# Patient Record
Sex: Female | Born: 1995 | Race: Black or African American | Hispanic: No | Marital: Single | Smoking: Never smoker
Health system: Southern US, Community
[De-identification: ages and names within clinical notes are randomized; demographics above are authoritative.]

---
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
## 2017-06-06 ENCOUNTER — Emergency Department (HOSPITAL_COMMUNITY)
Admission: EM | Admit: 2017-06-06 | Discharge: 2017-06-06 | Disposition: A | Discharge status: Home / Self Care | Payer: Self-pay | Attending: Physician Assistant | Admitting: Physician Assistant

## 2017-06-06 ENCOUNTER — Encounter (HOSPITAL_COMMUNITY): Payer: Self-pay | Admitting: *Deleted

## 2017-06-06 ENCOUNTER — Emergency Department (HOSPITAL_COMMUNITY): Payer: Self-pay

## 2017-06-06 DIAGNOSIS — Y9389 Activity, other specified: Secondary | ICD-10-CM | POA: Insufficient documentation

## 2017-06-06 DIAGNOSIS — T59811A Toxic effect of smoke, accidental (unintentional), initial encounter: Secondary | ICD-10-CM

## 2017-06-06 DIAGNOSIS — Z79899 Other long term (current) drug therapy: Secondary | ICD-10-CM | POA: Insufficient documentation

## 2017-06-06 DIAGNOSIS — J705 Respiratory conditions due to smoke inhalation: Secondary | ICD-10-CM | POA: Insufficient documentation

## 2017-06-06 DIAGNOSIS — Y92039 Unspecified place in apartment as the place of occurrence of the external cause: Secondary | ICD-10-CM | POA: Insufficient documentation

## 2017-06-06 DIAGNOSIS — X021XXA Exposure to smoke in controlled fire in building or structure, initial encounter: Secondary | ICD-10-CM | POA: Insufficient documentation

## 2017-06-06 LAB — CBC WITH DIFFERENTIAL/PLATELET
Basophils Absolute: 0 10*3/uL (ref 0.0–0.1)
Basophils Relative: 0 %
EOS ABS: 0 10*3/uL (ref 0.0–0.7)
EOS PCT: 0 %
HCT: 36.4 % (ref 36.0–46.0)
Hemoglobin: 11.5 g/dL — ABNORMAL LOW (ref 12.0–15.0)
Lymphocytes Relative: 26 %
Lymphs Abs: 1.4 10*3/uL (ref 0.7–4.0)
MCH: 26 pg (ref 26.0–34.0)
MCHC: 31.6 g/dL (ref 30.0–36.0)
MCV: 82.4 fL (ref 78.0–100.0)
Monocytes Absolute: 0.5 10*3/uL (ref 0.1–1.0)
Monocytes Relative: 9 %
Neutro Abs: 3.6 10*3/uL (ref 1.7–7.7)
Neutrophils Relative %: 65 %
PLATELETS: 328 10*3/uL (ref 150–400)
RBC: 4.42 MIL/uL (ref 3.87–5.11)
RDW: 16.2 % — ABNORMAL HIGH (ref 11.5–15.5)
WBC: 5.5 10*3/uL (ref 4.0–10.5)

## 2017-06-06 LAB — COMPREHENSIVE METABOLIC PANEL
ALT: 15 U/L (ref 14–54)
AST: 21 U/L (ref 15–41)
Albumin: 4.3 g/dL (ref 3.5–5.0)
Alkaline Phosphatase: 83 U/L (ref 38–126)
Anion gap: 8 (ref 5–15)
BILIRUBIN TOTAL: 0.4 mg/dL (ref 0.3–1.2)
BUN: 9 mg/dL (ref 6–20)
CHLORIDE: 104 mmol/L (ref 101–111)
CO2: 26 mmol/L (ref 22–32)
Calcium: 9.2 mg/dL (ref 8.9–10.3)
Creatinine, Ser: 0.73 mg/dL (ref 0.44–1.00)
Glucose, Bld: 96 mg/dL (ref 65–99)
POTASSIUM: 3.9 mmol/L (ref 3.5–5.1)
SODIUM: 138 mmol/L (ref 135–145)
TOTAL PROTEIN: 7.9 g/dL (ref 6.5–8.1)

## 2017-06-06 NOTE — Discharge Instructions (Signed)
We are glad you are feeling better.  Please return with any lightheadedness dizziness cough or other concerns.
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
## 2017-06-15 ENCOUNTER — Emergency Department (HOSPITAL_COMMUNITY)
Admission: EM | Admit: 2017-06-15 | Discharge: 2017-06-15 | Disposition: A | Discharge status: Home / Self Care | Payer: Self-pay | Attending: Emergency Medicine | Admitting: Emergency Medicine

## 2017-06-15 ENCOUNTER — Other Ambulatory Visit: Payer: Self-pay

## 2017-06-15 ENCOUNTER — Encounter (HOSPITAL_COMMUNITY): Payer: Self-pay | Admitting: Emergency Medicine

## 2017-06-15 DIAGNOSIS — K115 Sialolithiasis: Secondary | ICD-10-CM

## 2017-06-15 DIAGNOSIS — K1120 Sialoadenitis, unspecified: Secondary | ICD-10-CM | POA: Insufficient documentation

## 2017-06-15 LAB — COMPREHENSIVE METABOLIC PANEL
ALBUMIN: 4 g/dL (ref 3.5–5.0)
ALT: 29 U/L (ref 14–54)
ANION GAP: 10 (ref 5–15)
AST: 28 U/L (ref 15–41)
Alkaline Phosphatase: 78 U/L (ref 38–126)
BUN: 8 mg/dL (ref 6–20)
CHLORIDE: 104 mmol/L (ref 101–111)
CO2: 25 mmol/L (ref 22–32)
Calcium: 8.9 mg/dL (ref 8.9–10.3)
Creatinine, Ser: 0.73 mg/dL (ref 0.44–1.00)
GFR calc Af Amer: >60 mL/min (ref >60)
Glucose, Bld: 105 mg/dL — ABNORMAL HIGH (ref 65–99)
POTASSIUM: 3.5 mmol/L (ref 3.5–5.1)
Sodium: 139 mmol/L (ref 135–145)
Total Bilirubin: 0.5 mg/dL (ref 0.3–1.2)
Total Protein: 8 g/dL (ref 6.5–8.1)

## 2017-06-15 LAB — CBC WITH DIFFERENTIAL/PLATELET
Basophils Absolute: 0 10*3/uL (ref 0.0–0.1)
Basophils Relative: 0 %
EOS PCT: 0 %
Eosinophils Absolute: 0 10*3/uL (ref 0.0–0.7)
HEMATOCRIT: 36.8 % (ref 36.0–46.0)
HEMOGLOBIN: 11.8 g/dL — AB (ref 12.0–15.0)
LYMPHS ABS: 1.8 10*3/uL (ref 0.7–4.0)
LYMPHS PCT: 35 %
MCH: 26 pg (ref 26.0–34.0)
MCHC: 32.1 g/dL (ref 30.0–36.0)
MCV: 81.1 fL (ref 78.0–100.0)
Monocytes Absolute: 0.6 10*3/uL (ref 0.1–1.0)
Monocytes Relative: 11 %
NEUTROS ABS: 2.8 10*3/uL (ref 1.7–7.7)
Neutrophils Relative %: 54 %
Platelets: 363 10*3/uL (ref 150–400)
RBC: 4.54 MIL/uL (ref 3.87–5.11)
RDW: 16.1 % — ABNORMAL HIGH (ref 11.5–15.5)
WBC: 5.2 10*3/uL (ref 4.0–10.5)

## 2017-06-15 LAB — I-STAT BETA HCG BLOOD, ED (MC, WL, AP ONLY): I-stat hCG, quantitative: <5 m[IU]/mL (ref <5)

## 2017-06-15 MED ORDER — DEXAMETHASONE SODIUM PHOSPHATE 10 MG/ML IJ SOLN
10.0000 mg | Freq: Once | INTRAMUSCULAR | Status: AC
  Administered 2017-06-15: 10 mg via INTRAMUSCULAR
  Filled 2017-06-15: qty 1

## 2017-06-15 MED ORDER — AMOXICILLIN-POT CLAVULANATE 875-125 MG PO TABS: 1.0000 | ORAL_TABLET | Freq: Two times a day (BID) | ORAL | 0 refills | Status: AC

## 2017-06-15 NOTE — ED Provider Notes (Signed)
[facility]-EMERGENCY DEPT Provider Note   CSN: [id number] Arrival date & time: 06/15/17  1135     History   Chief Complaint Chief Complaint  Patient presents with  . Sore Throat  . Lymphadenopathy    HPI Heather Guerra is a 21 y.o. otherwise healthy female, who presents to the ED with complaints of right submandibular area swelling and pain that began while she was eating yesterday.  Patient states that she was eating when suddenly she noticed that her right jaw/submandibular area became swollen and was hurting.  She describes the pain as 3/10 intermittent throbbing and aching right jaw pain that radiates into her throat on the right side, worse with eating, and moderately improved with ibuprofen.  She denies any known sick contacts.  She has never had anything like this before.  She denies any dental pain, gum swelling or drainage, drooling, trismus, ear pain or drainage, rhinorrhea, other URI symptoms, fevers, chills, CP, SOB, abd pain, N/V/D/C, hematuria, dysuria, myalgias, arthralgias, numbness, tingling, focal weakness, or any other complaints at this time.    The history is provided by medical records and the patient. No language interpreter was used.  Sore Throat  This is a new problem. The current episode started yesterday. Episode frequency: intermittent. The problem has not changed since onset.Pertinent negatives include no chest pain, no abdominal pain and no shortness of breath. The symptoms are aggravated by eating. The symptoms are relieved by NSAIDs. Treatments tried: ibuprofen. The treatment provided moderate relief.    History reviewed. No pertinent past medical history.  There are no active problems to display for this patient.   History reviewed. No pertinent surgical history.  OB History    No data available       Home Medications    Prior to Admission medications   Not on File    Family History No family history on  file.  Social History Social History   Tobacco Use  . Smoking status: Never Smoker  . Smokeless tobacco: Never Used  Substance Use Topics  . Alcohol use: No  . Drug use: No     Allergies   Patient has no known allergies.   Review of Systems Review of Systems  Constitutional: Negative for chills and fever.  HENT: Positive for facial swelling and sore throat. Negative for dental problem, drooling, ear discharge, ear pain, rhinorrhea and trouble swallowing.   Respiratory: Negative for cough and shortness of breath.   Cardiovascular: Negative for chest pain.  Gastrointestinal: Negative for abdominal pain, constipation, diarrhea, nausea and vomiting.  Genitourinary: Negative for dysuria and hematuria.  Musculoskeletal: Negative for arthralgias and myalgias.  Skin: Negative for color change.  Allergic/Immunologic: Negative for immunocompromised state.  Neurological: Negative for weakness and numbness.  Psychiatric/Behavioral: Negative for confusion.   All other systems reviewed and are negative for acute change except as noted in the HPI.    Physical Exam Updated Vital Signs BP (!) 143/91 (BP Location: Left Arm)   Pulse (!) 104   Temp 98.6 F (37 C) (Oral)   Resp 18   LMP 06/06/2017   SpO2 100%   Physical Exam  Constitutional: She is oriented to person, place, and time. Vital signs are normal. She appears well-developed and well-nourished.  Non-toxic appearance. No distress.  Afebrile, nontoxic, NAD  HENT:  Head: Normocephalic and atraumatic.  Nose: Nose normal.  Mouth/Throat: Uvula is midline, oropharynx is clear and moist and mucous membranes are normal. No oral lesions. No trismus in  the jaw. No dental abscesses, uvula swelling or dental caries. Tonsils are 0 on the right. Tonsils are 0 on the left. No tonsillar exudate.  R submandibular gland with moderate swelling and mild tenderness, no overlying skin changes, no erythema or tenderness to wharton's and stensen's  ducts, no visible stone noticed in those ducts, teeth free of caries or abscess, no evidence of ludwig's, airway patent and handling secretions well.  Oropharynx clear and moist, without uvular swelling or deviation, no trismus or drooling, no tonsillar swelling or erythema, no exudates or PTA.  Nose clear.   Eyes: Conjunctivae and EOM are normal. Right eye exhibits no discharge. Left eye exhibits no discharge.  Neck: Normal range of motion. Neck supple.  Cardiovascular: Normal rate and intact distal pulses.  Tachycardia resolved on exam  Pulmonary/Chest: Effort normal. No respiratory distress.  Abdominal: Normal appearance. She exhibits no distension.  Musculoskeletal: Normal range of motion.  Lymphadenopathy:       Head (right side): Submandibular adenopathy present. No submental and no tonsillar adenopathy present.       Head (left side): No submental, no submandibular and no tonsillar adenopathy present.    She has no cervical adenopathy.  R submandibular swelling as mentioned above. No other head/neck LAD  Neurological: She is alert and oriented to person, place, and time. She has normal strength. No sensory deficit.  Skin: Skin is warm, dry and intact. No rash noted.  Psychiatric: She has a normal mood and affect. Her behavior is normal.  Nursing note and vitals reviewed.    ED Treatments / Results  Labs (all labs ordered are listed, but only abnormal results are displayed) Labs Reviewed  COMPREHENSIVE METABOLIC PANEL - Abnormal; Notable for the following components:      Result Value   Glucose, Bld 105 (*)    All other components within normal limits  CBC WITH DIFFERENTIAL/PLATELET - Abnormal; Notable for the following components:   Hemoglobin 11.8 (*)    RDW 16.1 (*)    All other components within normal limits  I-STAT BETA HCG BLOOD, ED ([facility], [facility], [facility] ONLY)    EKG  EKG Interpretation None       Radiology No results found.  Procedures Procedures (including critical  care time)  Medications Ordered in ED Medications  dexamethasone (DECADRON) injection 10 mg (10 mg Intramuscular Given 06/15/17 1649)     Initial Impression / Assessment and Plan / ED Course  I have reviewed the triage vital signs and the nursing notes.  Pertinent labs & imaging results that were available during my care of the patient were reviewed by me and considered in my medical decision making (see chart for details).     21 y.o. female here with R jaw swelling and pain x1 day. Was eating and suddenly noticed submandibular area was swelling. Hurts to eat. On exam, throat clear, no evidence of ludwig's, handling secretions well, no erythema or tenderness around wharton's and stensen's ducts, no obvious stone seen in these ducts, mild tenderness to submandibular gland which is moderately swollen. No other LAD. Labs done in triage are unremarkable, no leukocytosis. Likely sialolithiasis, but will cover for bacterial sialoadenitis just in case; will give decadron to help with swelling. Advised warm/cool compresses, adequate hydration, tylenol/motrin for pain, and f/up with PCP in 1wk for recheck. I explained the diagnosis and have given explicit precautions to return to the ER including for any other new or worsening symptoms. The patient understands and accepts the medical plan as  it's been dictated and I have answered their questions. Discharge instructions concerning home care and prescriptions have been given. The patient is STABLE and is discharged to home in good condition.    Final Clinical Impressions(s) / ED Diagnoses   Final diagnoses:  Sialoadenitis of submandibular gland  Sialolithiasis of submandibular gland    ED Discharge Orders        Ordered    amoxicillin-clavulanate (AUGMENTIN) 875-125 MG tablet  2 times daily     06/15/17 [address]1617       Caroline Longie, SilverdaleMercedes, [place] JerseyPA-C 06/15/17 1651    Arby BarrettePfeiffer, Marcy, MD 06/16/17 [phone]

## 2017-06-15 NOTE — Discharge Instructions (Signed)
Your symptoms are likely due to a stone that is lodged in the salivary gland duct. Eat sour candy/foods/beverages to try to dislodge the stone. Take antibiotic as directed. Use warm or cool compresses to the area to help with pain. Stay well hydrated. Alternate between tylenol and motrin as needed for pain. Follow up with your regular doctor in 1 week for recheck of symptoms. Return to the ER for any changes or worsening symptoms.

## 2017-06-15 NOTE — ED Triage Notes (Signed)
Pt complaint of sore throat and right side lymph swelling onset yesterday; painful to swallow.

## 2017-06-15 NOTE — ED Notes (Signed)
Pt verbalizes understanding of d/c paperwork, follow up instructions, and medications. Pt A/O x4, ambulatory. All belongings with patient upon departure.  
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
